# Patient Record
Sex: Male | Born: 1975 | Race: White | Hispanic: No | Marital: Single | Smoking: Never smoker
Health system: Southern US, Community
[De-identification: ages and names within clinical notes are randomized; demographics above are authoritative.]

## PROBLEM LIST (undated history)

## (undated) HISTORY — PX: CHOLECYSTECTOMY: SHX55

## (undated) HISTORY — PX: HERNIA REPAIR: SHX51

---

## 2014-06-04 ENCOUNTER — Inpatient Hospital Stay: Payer: Self-pay | Admitting: Surgery

## 2014-06-04 LAB — COMPREHENSIVE METABOLIC PANEL
ALBUMIN: 4.3 g/dL (ref 3.4–5.0)
ALK PHOS: 102 U/L
Anion Gap: 8 (ref 7–16)
BILIRUBIN TOTAL: 1.4 mg/dL — AB (ref 0.2–1.0)
BUN: 11 mg/dL (ref 7–18)
CHLORIDE: 106 mmol/L (ref 98–107)
Calcium, Total: 8.8 mg/dL (ref 8.5–10.1)
Co2: 27 mmol/L (ref 21–32)
Creatinine: 1.05 mg/dL (ref 0.60–1.30)
EGFR (African American): >60
EGFR (Non-African Amer.): >60
GLUCOSE: 121 mg/dL — AB (ref 65–99)
Osmolality: 282 (ref 275–301)
Potassium: 4 mmol/L (ref 3.5–5.1)
SGOT(AST): 85 U/L — ABNORMAL HIGH (ref 15–37)
SGPT (ALT): 53 U/L (ref 12–78)
Sodium: 141 mmol/L (ref 136–145)
Total Protein: 8 g/dL (ref 6.4–8.2)

## 2014-06-04 LAB — CBC WITH DIFFERENTIAL/PLATELET
BASOS ABS: 0.1 10*3/uL (ref 0.0–0.1)
Basophil %: 0.3 %
EOS ABS: 0 10*3/uL (ref 0.0–0.7)
Eosinophil %: 0.1 %
HCT: 48.3 % (ref 40.0–52.0)
HGB: 15.9 g/dL (ref 13.0–18.0)
LYMPHS PCT: 7.4 %
Lymphocyte #: 1.5 10*3/uL (ref 1.0–3.6)
MCH: 30.4 pg (ref 26.0–34.0)
MCHC: 33 g/dL (ref 32.0–36.0)
MCV: 92 fL (ref 80–100)
MONOS PCT: 5.3 %
Monocyte #: 1.1 x10 3/mm — ABNORMAL HIGH (ref 0.2–1.0)
NEUTROS ABS: 18.2 10*3/uL — AB (ref 1.4–6.5)
NEUTROS PCT: 86.9 %
PLATELETS: 176 10*3/uL (ref 150–440)
RBC: 5.24 10*6/uL (ref 4.40–5.90)
RDW: 13.4 % (ref 11.5–14.5)
WBC: 20.9 10*3/uL — AB (ref 3.8–10.6)

## 2014-06-04 LAB — LIPID PANEL
Cholesterol: 171 mg/dL (ref 0–200)
HDL Cholesterol: 55 mg/dL (ref 40–60)
Ldl Cholesterol, Calc: 99 mg/dL (ref 0–100)
TRIGLYCERIDES: 86 mg/dL (ref 0–200)
VLDL Cholesterol, Calc: 17 mg/dL (ref 5–40)

## 2014-06-04 LAB — LIPASE, BLOOD: Lipase: >10000 U/L — ABNORMAL HIGH (ref 73–393)

## 2014-06-05 LAB — CBC WITH DIFFERENTIAL/PLATELET
BASOS ABS: 0 10*3/uL (ref 0.0–0.1)
BASOS PCT: 0.2 %
EOS PCT: 0 %
Eosinophil #: 0 10*3/uL (ref 0.0–0.7)
HCT: 48.2 % (ref 40.0–52.0)
HGB: 15.7 g/dL (ref 13.0–18.0)
LYMPHS ABS: 0.6 10*3/uL — AB (ref 1.0–3.6)
Lymphocyte %: 5.1 %
MCH: 30.4 pg (ref 26.0–34.0)
MCHC: 32.6 g/dL (ref 32.0–36.0)
MCV: 93 fL (ref 80–100)
MONOS PCT: 5.1 %
Monocyte #: 0.6 x10 3/mm (ref 0.2–1.0)
NEUTROS ABS: 11 10*3/uL — AB (ref 1.4–6.5)
NEUTROS PCT: 89.6 %
Platelet: 144 10*3/uL — ABNORMAL LOW (ref 150–440)
RBC: 5.16 10*6/uL (ref 4.40–5.90)
RDW: 13.5 % (ref 11.5–14.5)
WBC: 12.2 10*3/uL — ABNORMAL HIGH (ref 3.8–10.6)

## 2014-06-05 LAB — BASIC METABOLIC PANEL
Anion Gap: 5 — ABNORMAL LOW (ref 7–16)
BUN: 10 mg/dL (ref 7–18)
Calcium, Total: 8.4 mg/dL — ABNORMAL LOW (ref 8.5–10.1)
Chloride: 107 mmol/L (ref 98–107)
Co2: 30 mmol/L (ref 21–32)
Creatinine: 1.06 mg/dL (ref 0.60–1.30)
EGFR (Non-African Amer.): >60
GLUCOSE: 149 mg/dL — AB (ref 65–99)
Osmolality: 285 (ref 275–301)
Potassium: 4.8 mmol/L (ref 3.5–5.1)
Sodium: 142 mmol/L (ref 136–145)

## 2014-06-05 LAB — URINALYSIS, COMPLETE
Bacteria: NONE SEEN
Bilirubin,UR: NEGATIVE
Blood: NEGATIVE
GLUCOSE, UR: NEGATIVE mg/dL (ref 0–75)
LEUKOCYTE ESTERASE: NEGATIVE
NITRITE: NEGATIVE
Ph: 5 (ref 4.5–8.0)
Protein: NEGATIVE
RBC,UR: <1 /HPF (ref 0–5)
SPECIFIC GRAVITY: 1.017 (ref 1.003–1.030)
Squamous Epithelial: NONE SEEN

## 2014-06-06 LAB — HEPATIC FUNCTION PANEL A (ARMC)
AST: 46 U/L — AB (ref 15–37)
Albumin: 3 g/dL — ABNORMAL LOW (ref 3.4–5.0)
Alkaline Phosphatase: 62 U/L
BILIRUBIN DIRECT: 1.7 mg/dL — AB (ref 0.00–0.20)
BILIRUBIN TOTAL: 4.1 mg/dL — AB (ref 0.2–1.0)
SGPT (ALT): 29 U/L (ref 12–78)
Total Protein: 6.3 g/dL — ABNORMAL LOW (ref 6.4–8.2)

## 2014-06-06 LAB — LIPASE, BLOOD: LIPASE: 3643 U/L — AB (ref 73–393)

## 2014-06-07 LAB — BASIC METABOLIC PANEL
Anion Gap: 6 — ABNORMAL LOW (ref 7–16)
BUN: 7 mg/dL (ref 7–18)
CALCIUM: 7.8 mg/dL — AB (ref 8.5–10.1)
CHLORIDE: 105 mmol/L (ref 98–107)
CO2: 30 mmol/L (ref 21–32)
Creatinine: 0.95 mg/dL (ref 0.60–1.30)
EGFR (African American): >60
Glucose: 96 mg/dL (ref 65–99)
OSMOLALITY: 279 (ref 275–301)
POTASSIUM: 3.8 mmol/L (ref 3.5–5.1)
SODIUM: 141 mmol/L (ref 136–145)

## 2014-06-07 LAB — CBC WITH DIFFERENTIAL/PLATELET
BASOS ABS: 0 10*3/uL (ref 0.0–0.1)
Basophil %: 0.2 %
EOS PCT: 0.1 %
Eosinophil #: 0 10*3/uL (ref 0.0–0.7)
HCT: 40.8 % (ref 40.0–52.0)
HGB: 13.3 g/dL (ref 13.0–18.0)
LYMPHS PCT: 7.3 %
Lymphocyte #: 1.2 10*3/uL (ref 1.0–3.6)
MCH: 30.3 pg (ref 26.0–34.0)
MCHC: 32.5 g/dL (ref 32.0–36.0)
MCV: 93 fL (ref 80–100)
Monocyte #: 1 x10 3/mm (ref 0.2–1.0)
Monocyte %: 6.6 %
NEUTROS ABS: 13.7 10*3/uL — AB (ref 1.4–6.5)
Neutrophil %: 85.8 %
Platelet: 101 10*3/uL — ABNORMAL LOW (ref 150–440)
RBC: 4.37 10*6/uL — ABNORMAL LOW (ref 4.40–5.90)
RDW: 13.8 % (ref 11.5–14.5)
WBC: 15.9 10*3/uL — AB (ref 3.8–10.6)

## 2014-06-07 LAB — HEPATIC FUNCTION PANEL A (ARMC)
ALT: 24 U/L (ref 12–78)
AST: 42 U/L — AB (ref 15–37)
Albumin: 2.6 g/dL — ABNORMAL LOW (ref 3.4–5.0)
Alkaline Phosphatase: 56 U/L
BILIRUBIN DIRECT: 4.5 mg/dL — AB (ref 0.00–0.20)
Bilirubin,Total: 7.7 mg/dL — ABNORMAL HIGH (ref 0.2–1.0)
TOTAL PROTEIN: 5.9 g/dL — AB (ref 6.4–8.2)

## 2014-06-07 LAB — LIPASE, BLOOD: Lipase: 984 U/L — ABNORMAL HIGH (ref 73–393)

## 2014-06-08 LAB — COMPREHENSIVE METABOLIC PANEL
ALT: 23 U/L (ref 12–78)
ANION GAP: 5 — AB (ref 7–16)
Albumin: 2.5 g/dL — ABNORMAL LOW (ref 3.4–5.0)
Alkaline Phosphatase: 58 U/L
BUN: 5 mg/dL — AB (ref 7–18)
Bilirubin,Total: 8.6 mg/dL — ABNORMAL HIGH (ref 0.2–1.0)
CHLORIDE: 101 mmol/L (ref 98–107)
Calcium, Total: 7.8 mg/dL — ABNORMAL LOW (ref 8.5–10.1)
Co2: 33 mmol/L — ABNORMAL HIGH (ref 21–32)
Creatinine: 0.83 mg/dL (ref 0.60–1.30)
EGFR (African American): >60
EGFR (Non-African Amer.): >60
Glucose: 105 mg/dL — ABNORMAL HIGH (ref 65–99)
Osmolality: 275 (ref 275–301)
POTASSIUM: 4.1 mmol/L (ref 3.5–5.1)
SGOT(AST): 33 U/L (ref 15–37)
Sodium: 139 mmol/L (ref 136–145)
Total Protein: 6.1 g/dL — ABNORMAL LOW (ref 6.4–8.2)

## 2014-06-08 LAB — CBC WITH DIFFERENTIAL/PLATELET
BASOS ABS: 0 10*3/uL (ref 0.0–0.1)
Basophil %: 0.3 %
EOS ABS: 0.1 10*3/uL (ref 0.0–0.7)
Eosinophil %: 0.4 %
HCT: 38.9 % — ABNORMAL LOW (ref 40.0–52.0)
HGB: 13 g/dL (ref 13.0–18.0)
LYMPHS PCT: 7.3 %
Lymphocyte #: 1 10*3/uL (ref 1.0–3.6)
MCH: 30.9 pg (ref 26.0–34.0)
MCHC: 33.3 g/dL (ref 32.0–36.0)
MCV: 93 fL (ref 80–100)
MONO ABS: 1.3 x10 3/mm — AB (ref 0.2–1.0)
Monocyte %: 9.5 %
NEUTROS ABS: 10.9 10*3/uL — AB (ref 1.4–6.5)
Neutrophil %: 82.5 %
PLATELETS: 123 10*3/uL — AB (ref 150–440)
RBC: 4.19 10*6/uL — AB (ref 4.40–5.90)
RDW: 13.7 % (ref 11.5–14.5)
WBC: 13.2 10*3/uL — AB (ref 3.8–10.6)

## 2014-06-08 LAB — LIPASE, BLOOD: LIPASE: 274 U/L (ref 73–393)

## 2014-06-09 LAB — COMPREHENSIVE METABOLIC PANEL
ALT: 18 U/L (ref 12–78)
Albumin: 2.3 g/dL — ABNORMAL LOW (ref 3.4–5.0)
Alkaline Phosphatase: 59 U/L
Anion Gap: 7 (ref 7–16)
BILIRUBIN TOTAL: 10.1 mg/dL — AB (ref 0.2–1.0)
BUN: 4 mg/dL — ABNORMAL LOW (ref 7–18)
CALCIUM: 7.5 mg/dL — AB (ref 8.5–10.1)
CHLORIDE: 98 mmol/L (ref 98–107)
CREATININE: 0.72 mg/dL (ref 0.60–1.30)
Co2: 32 mmol/L (ref 21–32)
EGFR (Non-African Amer.): >60
Glucose: 100 mg/dL — ABNORMAL HIGH (ref 65–99)
OSMOLALITY: 271 (ref 275–301)
Potassium: 3.4 mmol/L — ABNORMAL LOW (ref 3.5–5.1)
SGOT(AST): 22 U/L (ref 15–37)
Sodium: 137 mmol/L (ref 136–145)
Total Protein: 5.9 g/dL — ABNORMAL LOW (ref 6.4–8.2)

## 2014-06-09 LAB — CBC WITH DIFFERENTIAL/PLATELET
BASOS PCT: 0.2 %
Basophil #: 0 10*3/uL (ref 0.0–0.1)
Eosinophil #: 0.2 10*3/uL (ref 0.0–0.7)
Eosinophil %: 1.2 %
HCT: 36 % — AB (ref 40.0–52.0)
HGB: 11.9 g/dL — AB (ref 13.0–18.0)
Lymphocyte #: 0.9 10*3/uL — ABNORMAL LOW (ref 1.0–3.6)
Lymphocyte %: 7 %
MCH: 30.5 pg (ref 26.0–34.0)
MCHC: 33.1 g/dL (ref 32.0–36.0)
MCV: 92 fL (ref 80–100)
Monocyte #: 1.4 x10 3/mm — ABNORMAL HIGH (ref 0.2–1.0)
Monocyte %: 11.3 %
NEUTROS ABS: 10.3 10*3/uL — AB (ref 1.4–6.5)
NEUTROS PCT: 80.3 %
Platelet: 113 10*3/uL — ABNORMAL LOW (ref 150–440)
RBC: 3.91 10*6/uL — AB (ref 4.40–5.90)
RDW: 13.7 % (ref 11.5–14.5)
WBC: 12.8 10*3/uL — AB (ref 3.8–10.6)

## 2014-06-09 LAB — HEPATIC FUNCTION PANEL A (ARMC): Bilirubin, Direct: 7.3 mg/dL — ABNORMAL HIGH (ref 0.00–0.20)

## 2014-06-10 LAB — HEPATIC FUNCTION PANEL A (ARMC)
ALBUMIN: 2.3 g/dL — AB (ref 3.4–5.0)
ALT: 21 U/L (ref 12–78)
Alkaline Phosphatase: 71 U/L
BILIRUBIN DIRECT: 9 mg/dL — AB (ref 0.00–0.20)
Bilirubin,Total: 12.6 mg/dL — ABNORMAL HIGH (ref 0.2–1.0)
SGOT(AST): 25 U/L (ref 15–37)
TOTAL PROTEIN: 6 g/dL — AB (ref 6.4–8.2)

## 2014-06-10 LAB — CBC WITH DIFFERENTIAL/PLATELET
Basophil #: 0 10*3/uL (ref 0.0–0.1)
Basophil %: 0.1 %
EOS ABS: 0.2 10*3/uL (ref 0.0–0.7)
EOS PCT: 1.2 %
HCT: 38.4 % — AB (ref 40.0–52.0)
HGB: 13.1 g/dL (ref 13.0–18.0)
LYMPHS ABS: 0.9 10*3/uL — AB (ref 1.0–3.6)
LYMPHS PCT: 6.5 %
MCH: 31.2 pg (ref 26.0–34.0)
MCHC: 34.1 g/dL (ref 32.0–36.0)
MCV: 92 fL (ref 80–100)
MONO ABS: 1.6 x10 3/mm — AB (ref 0.2–1.0)
MONOS PCT: 12.2 %
NEUTROS PCT: 80 %
Neutrophil #: 10.7 10*3/uL — ABNORMAL HIGH (ref 1.4–6.5)
Platelet: 154 10*3/uL (ref 150–440)
RBC: 4.2 10*6/uL — AB (ref 4.40–5.90)
RDW: 14.1 % (ref 11.5–14.5)
WBC: 13.4 10*3/uL — AB (ref 3.8–10.6)

## 2014-06-10 LAB — LIPASE, BLOOD: LIPASE: 88 U/L (ref 73–393)

## 2015-03-14 NOTE — Consult Note (Signed)
PATIENT NAME:  Anthony [name], Anthony [name] MR#:  [id number] DATE OF BIRTH:  1976/02/10  DATE OF CONSULTATION:  06/07/2014  REFERRING PHYSICIAN:   CONSULTING PHYSICIAN:  Christena DeemMartin U. Livvy Spilman, MD  [name] patient of Dr. Natale LayMark Bird.  REASON FOR CONSULTATION: Increased bilirubin, and pancreatitis. Possible need for an ERCP.   HISTORY OF PRESENT ILLNESS: Mr. Anthony [name] is [name] 39 year old Caucasian male, who was in his usual state of health until this past Wednesday. He states that on Wednesday, he had [name] sensation of [name] gas bubble in the epigastric region extending toward both the right and left upper quadrants. This was then followed with some sharp pain, as well as emesis. The pain increased over the course of Thursday, followed by dry heaves. He did come to the hospital late on the evening of the 15th and was admitted for further evaluation and treatment. He was found to have pancreatitis. Over the course of his hospitalization, his lipase has improved. However, his total bilirubin has increased. There was an attempt at an MRCP for further definition of the bile ducts; however, due to patient movement, this was not [name] good study. He states he has had no previous pancreatitis. He did have some similar symptoms back in January. He was [facility] at [facility] for [name] similar presentation, though not as severe. He was told he had [name] bruised sternum and also told he had [name] finding of gallstones at that time. He is currently feeling somewhat better from admission. He is not having near as much emesis and nausea. He continues to be somewhat distended. He has not been passing flatus or stool. He puts his pain level at [name] perhaps 6 to 7 to 10.   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 He denies other hospitalizations or ongoing medical treatment.   SOCIAL HISTORY: Occasional alcohol and tobacco usage. There is no drug usage.    GASTROINTESTINAL FAMILY HISTORY: Negative for colorectal cancer, liver disease or ulcers.   REVIEW OF SYSTEMS: 10 systems reviewed per admission history and physical, agree with same.   PHYSICAL EXAMINATION:  VITAL SIGNS: Temperature is 99.6, pulse 126, respirations 20, blood pressure 162/107 to 150/94, pulse oximetry 94%.  GENERAL: He is [name] 39 year old Caucasian male, no acute distress.  HEENT: Normocephalic, atraumatic. Eyes show [name] mild to moderate scleral icterus. Nose: Septum midline. Oropharynx: No lesions.  HEART: Regular rate and rhythm.  LUNGS: Clear.  ABDOMEN: Mildly to moderately distended, not firm. Bowel sounds are negative. He is tender to palpation throughout the epigastric region, mostly in the left upper quadrant. He has [name] mild to moderate generalized abdominal tenderness, as well. There is no rebound.  EXTREMITIES: No clubbing, cyanosis, or edema.  NEUROLOGICAL: Cranial nerves II through XII grossly intact.   LABORATORY DATA: On admission to the hospital, July 15 at 2148, he had [name] glucose of 121, BUN 11. His metabolic panel otherwise normal. His lipase was greater than 10,000. The lipase has improved over the course of the last 3 days to this morning, being 984. His hepatic profile on admission showed [name] total protein of 8.0, albumin 4.3, total bilirubin 1.4, direct was not done at that time. Alkaline phosphatase 102, AST 85, and ALT 53. LFTs today showed [name] total protein of 5.9, albumin of 2.6, total bilirubin 7.7, direct 4.5, alkaline phosphatase 56, AST 42, ALT 24. His hemogram showed [name] white count  of 20.9 on admission. Currently today 15.9. Hemoglobin and hematocrit 13.3/40.8 today. His platelet count 101,000. Urinalysis was negative.   He had sinus bradycardia, poor quality ECG.   He had an abdominal ultrasound on July 16. This showed the common bile duct to be 4 mm. No intrahepatic or extrahepatic biliary ductal dilatation. There was the impression of cholelithiasis, small  echogenic foci in the neck region, which was shadow, but did not appreciable move.   He had an MR cholangiogram done showing [name] severely degraded examination by patient motion. The patient requested to be removed from the scanner several times. He was also apparently unable to hold his breath at the time. There was [name] note of extensive retroperitoneal edema and fluid consistent with acute pancreatitis. The pancreatic parenchyma was uniformly enhancing with also peripancreatic edema. There was cholelithiasis without clear evidence of cholecystitis.   ASSESSMENT: Biliary pancreatitis. He has had an increasing total and direct bilirubin. His lipase has been improving. There is [name] large amount of edema noted on imaging in the retroperitoneum. It is quite likely that edema locally in the pancreas is inhibiting drainage of the biliary system. It is also possible that there is [name] stone in place that could not be imaged on the magnetic resonance cholangiopancreatography, as noted. Symptomatically he is improving. He is still distended and is not yet passing flatus. Agree with endoscopic retrograde cholangiopancreatography. We will not have this ability until Monday. If there is any acute change requiring endoscopic retrograde cholangiopancreatography  more urgently, we will need to transfer him to available institution. Continue hydration, pain control, and n.p.o. for now.   We will follow with you.   ____________________________ Christena Deem, MD mus:jr D: 06/07/2014 17:48:38 ET T: 06/07/2014 19:37:28 ET JOB#: [id number]  cc: Christena Deem, MD, <Dictator>  Christena Deem MD ELECTRONICALLY SIGNED 06/19/2014 17:44

## 2015-03-14 NOTE — Consult Note (Signed)
Chief Complaint:  Subjective/Chief Complaint seen for biliary pancreatitis, increasing bilirubin. pain 8/10 without meds, passed a small amount of flatus early this am. no bm.   VITAL SIGNS/ANCILLARY NOTES: **Vital Signs.:   19-Jul-15 07:22  Vital Signs Type Q 8hr  Temperature Temperature (F) 99.5  Celsius 37.5  Temperature Source oral  Pulse Pulse 97  Respirations Respirations 18  Systolic BP Systolic BP 409  Diastolic BP (mmHg) Diastolic BP (mmHg) 811  Mean BP 124  Pulse Ox % Pulse Ox % 92  Pulse Ox Activity Level  At rest  Oxygen Delivery Room Air/ 21 %   Brief Assessment:  GEN jaundice   Cardiac Regular   Respiratory clear BS   Gastrointestinal details normal No rebound tenderness  mild distension, bs postive but distant, generalized tenderness,, mostly across epigastrum and llq.   Lab Results: Hepatic:  15-Jul-15 21:48   Bilirubin, Total  1.4  Alkaline Phosphatase 102 (45-117 NOTE: New Reference Range 10/11/13)  SGPT (ALT) 53  SGOT (AST)  85  17-Jul-15 04:55   Bilirubin, Total  4.1  Alkaline Phosphatase 62 (45-117 NOTE: New Reference Range 10/11/13)  SGPT (ALT) 29  SGOT (AST)  46  18-Jul-15 05:30   Bilirubin, Total  7.7  Alkaline Phosphatase 56 (45-117 NOTE: New Reference Range 10/11/13)  SGPT (ALT) 24  SGOT (AST)  42  19-Jul-15 04:53   Bilirubin, Total  8.6  Alkaline Phosphatase 58 (45-117 NOTE: New Reference Range 10/11/13)  SGPT (ALT) 23  SGOT (AST) 33  Total Protein, Serum  6.1  Albumin, Serum  2.5  Routine Chem:  15-Jul-15 21:48   Lipase  > 10000 (Result(s) reported on 04 Jun 2014 at 10:24PM.)  17-Jul-15 04:55   Lipase  3643 (Result(s) reported on 06 Jun 2014 at [facility].)  18-Jul-15 05:30   Lipase  984 (Result(s) reported on 07 Jun 2014 at 06:29AM.)  19-Jul-15 04:53   Glucose, Serum  105  BUN  5  Creatinine (comp) 0.83  Sodium, Serum 139  Potassium, Serum 4.1  Chloride, Serum 101  CO2, Serum  33  Calcium (Total), Serum  7.8   Osmolality (calc) 275  eGFR (African American) >60  eGFR (Non-African American) >60 (eGFR values <64m/min/1.73 m2 may be an indication of chronic kidney disease (CKD). Calculated eGFR is useful in patients with stable renal function. The eGFR calculation will not be reliable in acutely ill patients when serum creatinine is changing rapidly. It is not useful in  patients on dialysis. The eGFR calculation may not be applicable to patients at the low and high extremes of body sizes, pregnant women, and vegetarians.)  Anion Gap  5  Lipase 274 (Result(s) reported on 08 Jun 2014 at 05:37AM.)  Routine Hem:  15-Jul-15 21:48   WBC (CBC)  20.9  16-Jul-15 04:57   WBC (CBC)  12.2  18-Jul-15 05:30   WBC (CBC)  15.9  19-Jul-15 04:53   WBC (CBC)  13.2  RBC (CBC)  4.19  Hemoglobin (CBC) 13.0  Hematocrit (CBC)  38.9  Platelet Count (CBC)  123  MCV 93  MCH 30.9  MCHC 33.3  RDW 13.7  Neutrophil % 82.5  Lymphocyte % 7.3  Monocyte % 9.5  Eosinophil % 0.4  Basophil % 0.3  Neutrophil #  10.9  Lymphocyte # 1.0  Monocyte #  1.3  Eosinophil # 0.1  Basophil # 0.0 (Result(s) reported on 08 Jun 2014 at 05:33AM.)   Radiology Results: [place]    16-Jul-15 00:01, [place]  [place]  Abdomen Limited Survey   REASON FOR EXAM:    ab dpain  COMMENTS:   Body Site: Gallbladder, Liver, Common Bile Duct    PROCEDURE: [place]  - US ABDOMEN LIMITED SURVEY  - Jun 05 2014 12:01AM     CLINICAL DATA:  Abdominal pain    EXAM:  US ABDOMEN LIMITED - RIGHT UPPER QUADRANT    COMPARISON:  None.    FINDINGS:  Gallbladder:  Within the gallbladder, there are small echogenic foci in the neck  region which shadow but do not appreciably move. The largest of  these foci measures 5 mm in length. There is no gallbladder wall  thickening or pericholecystic fluid. No sonographic Murphy sign  noted.    Common bile duct:    Diameter: 4 mm. There is no intrahepatic or extrahepatic biliary  duct  dilatation.    Liver:    No focal lesion identified. Within normal limits in parenchymal  echogenicity.   IMPRESSION:  Cholelithiasis.      Electronically Signed    By: Lowella Grip M.D.    On: 06/05/2014 00:58         Verified By: Leafy Kindle. WOODRUFF, M.D.,  MRI:    17-Jul-15 12:03, MRCP MR Cholangiogram  MRCP MR Cholangiogram   REASON FOR EXAM:    gallstone induced pancreatitis  COMMENTS:       PROCEDURE: MR  - MR MRCP  - Jun 06 2014 12:03PM     CLINICAL DATA:  Gallstone pancreatitis.    EXAM:  MRI ABDOMEN WITHOUT AND WITH CONTRAST (INCLUDING MRCP)    TECHNIQUE:  Multiplanar multisequence MR imaging of the abdomen was performed  both before and after the administration of intravenous contrast.  Heavily T2-weighted images of the biliary and pancreatic ducts were  obtained, and three-dimensional MRCP images were rendered by post  processing.    CONTRAST:  20 mL MultiHance    COMPARISON:  Ultrasound 06/04/2014    FINDINGS:  Exam is severely degraded by patient motion. Patient requested tp be  removed from the scanner several times.    There is significant mild fluid along the anterior perirenal space  on the left and right extending along the pericolic gutters and  along the liver and spleen. This would be consistent given history  pancreatitis.  There are several small gallstones within the neck of the  gallbladder. The gallbladder is not distended and there is no  significant gallbladder wall thickening. There is small amount of  pericholecystic fluid.    The MRCP sequences are are not satisfactory for evaluation due to  patient motion. However, the bile ductdoes not appear dilated on  the axial T2 weighted series (series 21).    On the post-contrast present on the axial T1 series the pancreas is  slightly irregular. There are no organized fluid collections  identified. Pancreas does not appear to enhance uniformly.    The spleen, adrenal glands,  and kidneys are normal. Review of the  lung bases demonstrate indents the pleural fluid. There is no  pericardial fluid.     IMPRESSION:  1. MRCP images are severely degraded due to patient respiratory  motion and are uninterpretable. Often patient's with acute  pancreatitis are poor MRI due to inability to hold breath.  2. On the T2 weighted imaging, there is no evidence of biliary duct  dilatation or common bile duct dilatation  3. Extensive retroperitoneal edema and fluid consistent wit acute  pancreatitis. No organized fluid collections. The pancreatic  parenchyma enhances uniformly. There is pancreatic edema.  4. Cholelithiasis without clear evidence of cholecystitis.      Electronically Signed    By: Suzy Bouchard M.D.    On: 06/06/2014 12:34         Verified By: Rennis Golden, M.D.,   Assessment/Plan:  Assessment/Plan:  Assessment 1) biliary pancreatitis-lipase normalized.  continues with abdominal pain and increasing bili.  likely CBD stone, with obstruction.  lfts otherwise normalized.   Plan 1) continue current.  ERCP for tomorrow.  Will discuss with Dr Candace Cruise or Dr Allen Norris in am.  Low threshold to start abx.  recheck labs in am.   Electronic Signatures: Loistine Simas (MD)  (Signed 19-Jul-15 14:51)  Authored: Chief Complaint, VITAL SIGNS/ANCILLARY NOTES, Brief Assessment, Lab Results, Radiology Results, Assessment/Plan   Last Updated: 19-Jul-15 14:51 by Loistine Simas (MD)

## 2015-03-14 NOTE — Consult Note (Signed)
Brief Consult Note: Diagnosis: biliary pancreatitis.   Patient was seen by consultant.   Consult note dictated.   Recommend to proceed with surgery or procedure.   Recommend further assessment or treatment.   Comments: Please see full GI consult [phone]#[id number].  Patietn admitted with abd pain.n/v, biliary pancreatitis.  Clinically improving, lipase improving, however increasing bili.  Possible impairment of bile duct drainage due to edema versus bile duct stone.  ERCP monday depending on clinical situation.  If there increased problem before then, may need transfer to facility with ERCP availability sooner.  However, currently stable,  consider abx if increased sx.  Following, discussed with Dr Egbert GaribaldiBird.  Electronic Signatures: Barnetta ChapelSkulskie, Martin (MD)  (Signed 18-Jul-15 17:56)  Authored: Brief Consult Note   Last Updated: 18-Jul-15 17:56 by Barnetta ChapelSkulskie, Martin (MD)

## 2015-03-14 NOTE — Consult Note (Signed)
Pt with gallstone pancreatitis. Asked to do ERCP. Risks and complications discussed with patient. Pt intubated for the procedure.Pt with significant edema of duodenum. Spent lot of time tryning to get duodenoscope in position. Unable to locate ampulla due to edema. Elected to stop. Rising bilirubin may be due to signif pancreatitis/edema pressing on the bile duct. If that is the case, jaundice will gradually resolve own. However, if there is a stone causing CBD obstruction, thenpt will need either percutaneous drainage by IR and repeat ERCP at a tertiary center. Discussed with Dr. Skulskie. thanks  ElectrMarva Pandaonic Signatures: Lutricia Feilh, Nikitia Asbill (MD)  (Signed on 20-Jul-15 11:32)  Authored  Last Updated: 20-Jul-15 11:32 by Lutricia Feilh, Dayle Sherpa (MD)

## 2015-03-14 NOTE — Discharge Summary (Signed)
PATIENT NAME:  Anthony [name], Anthony [name] MR#:  [id number] DATE OF BIRTH:  04-Jul-1976  DATE OF ADMISSION:  06/04/2014 DATE OF DISCHARGE:  06/10/2014  FINAL DIAGNOSIS: Biliary pancreatitis and choledocholithiasis.   PRINCIPAL PROCEDURES:  1. MRCP of the abdomen.  2. Surgical consultation.  3. Attempted ERCP by Dr. Bluford Kaufmannh, which was unsuccessful, transferred [facility].  HOSPITAL COURSE SUMMARY: The patient was admitted with significant abdominal pain, found to have pancreatitis and gallstones. An MRCP was performed; however, due to the lack of patient cooperation, the study was not deemed useful. Over the course of the next several days, he had [name] progressive rise in his bilirubin. GI medicine did get consulted. An ERCP was attempted on July 20; however, the ampulla was unable to be cannulated. The [facility] was therefore [facility] for [facility] management.   Medication reconciliation form is found in the chart.    ____________________________ Redge GainerMark [name]. Egbert GaribaldiBird, MD mab:jr D: 06/17/2014 19:33:43 ET T: 06/17/2014 19:54:07 ET JOB#: [id number]  cc: Loraine LericheMark [name]. Egbert GaribaldiBird, MD, <Dictator> Millan Legan [name] Bryah Ocheltree MD ELECTRONICALLY SIGNED 06/17/2014 21:00

## 2015-03-14 NOTE — Consult Note (Signed)
Brief Consult Note: Diagnosis: Biliary colic and GS related pancreatitis.   Patient was seen by consultant.   Recommend further assessment or treatment.   Discussed with Attending MD.   Comments: Once pancreatitis resolves need elective lap chole. will follow.  Electronic Signatures: Natale LayBird, Leocadio Heal (MD)  (Signed 16-Jul-15 15:00)  Authored: Brief Consult Note   Last Updated: 16-Jul-15 15:00 by Natale LayBird, Meganne Rita (MD)

## 2015-03-14 NOTE — H&P (Signed)
PATIENT NAME:  Anthony [name], Anthony [name] MR#:  [id number] DATE OF BIRTH:  [birth year]  DATE OF ADMISSION:  06/04/2014  REFERRING PHYSICIAN:  Dr. Mindi Junker.   PRIMARY CARE PHYSICIAN:  Nonlocal.   CHIEF COMPLAINT:  Abdominal pain.   HISTORY OF PRESENT ILLNESS:  A 39 year old gentleman with history of Gorlin syndrome with history of VP shunt placement presenting with abdominal pain.  Describes acute onset of abdominal pain located right upper quadrant with radiation to the general abdomen.  Pain described as sharp in quality, 8 to 10 in intensity.  No worsening or relieving factors, associated with nausea, vomiting, progressively worsened, presented to the hospital for further work-up and evaluation, found to have pancreatitis with markedly elevated lipase.   REVIEW OF SYSTEMS:  CONSTITUTIONAL:  Denies fever, chills, fatigue, weakness.  EYES:  Denies blurred vision, double vision, eye pain.  EARS, NOSE, THROAT:  Denies tinnitus, ear pain, hearing loss.  RESPIRATORY:  Denies cough, wheeze, shortness of breath.  CARDIOVASCULAR:  Denies chest pain, palpitations, edema.  GASTROINTESTINAL:  Positive for nausea, vomiting, abdominal pain as described above.  Denies any diarrhea or constipation.  GENITOURINARY:  Denies dysuria, hematuria.  ENDOCRINE:  Denies any nocturia or thyroid problems.  HEMATOLOGIC AND LYMPHATIC:  Denies easy bruising, bleeding.  SKIN:  Denies rash or lesions.  MUSCULOSKELETAL:  Denies pain in neck, back, shoulder, knees, hips or arthritic symptoms.  NEUROLOGIC:  Denies paralysis, paresthesias.  PSYCHIATRIC:  Denies anxiety or depressive symptoms.  Otherwise, full review of systems performed by me is negative.   PAST MEDICAL HISTORY:  Gorlin syndrome status post VP shunt, otherwise no further medical history.   SOCIAL HISTORY:  Positive for occasional alcohol usage as well as tobacco usage.  Denies any drug usage.   FAMILY HISTORY:  Positive for hypertension.   ALLERGIES:  DEMEROL,  WHICH CAUSES HALLUCINATIONS.  MORPHINE CAUSING RASH.  HOME MEDICATIONS:  None.   PHYSICAL EXAMINATION: VITAL SIGNS:  Temperature 98.4, heart rate 63, respirations 14, blood pressure 136/85, saturating 100% on room air.  Weight 113.4, BMI 36.9.  GENERAL:  Well-nourished, well-developed, Caucasian gentleman, currently in minimal distress given abdominal pain.  HEAD:  Normocephalic, atraumatic.  EYES:  Pupils equal, round, reactive to light.  Extraocular muscles intact.  No scleral icterus.  MOUTH:  Moist mucous membranes.  Dentition intact.  No abscess noted.  EAR, NOSE, THROAT:  Clear without exudates.  No external lesions.  NECK:  Supple.  No thyromegaly.  No nodules.  No JVD.  PULMONARY:  Clear to auscultation bilaterally without wheezes, rubs or rhonchi.  No use of accessory muscles.  Good respiratory effort.  CHEST:  Nontender to palpation.  CARDIOVASCULAR:  S1, S2, regular rate and rhythm.  No murmurs, rubs, or gallops.  No edema.  Pedal pulses 2+ bilaterally.  GASTROINTESTINAL:  Soft, generally tender to palpation over the entirety of the abdomen, more so on the right as compared to the left.  No rebound or guarding.  No motion tenderness.  Positive bowel sounds.  No hepatosplenomegaly.  MUSCULOSKELETAL:  No swelling, clubbing, edema.  Range of motion full in all extremities.  NEUROLOGIC:  Cranial nerves II through XII intact.  No gross focal neurological deficits.  Sensation intact.  Reflexes intact. SKIN:  No ulceration, lesions, rash, or cyanosis.  Skin warm, dry.  Turgor intact.  PSYCHIATRIC:  Affect within normal limits.  The patient is awake, alert, oriented x 3.  Insight and judgment intact.   LABORATORY DATA:  Sodium 141, potassium 4, chloride 106,  BUN 11, creatinine 1.05, glucose 121, lipase greater than 10,000.  LFTs:  Bilirubin 1.5, AST 85, otherwise within normal limits.  WBC 20.9, hemoglobin 15.9, platelets of 176.   ASSESSMENT AND PLAN:  A 39 year old Caucasian gentleman  with history of Gorlin syndrome, status post ventriculoperitoneal shunt presenting with abdominal pain.  1.  Pancreatitis, given history as well as mildly elevated bilirubin.  Possible gallstone pancreatitis.  We will check a right upper quadrant ultrasound plus he is to remain nothing by mouth until pain improves.  Intravenous fluid hydration, normal saline, pain medications as well as check lipid panel for completeness looking for hypertriglyceridemia given young age.  2.  Leukocytosis.  No indication of infection at this time.  No indication for antibiotics.  3.  Venous thromboembolism prophylaxis with heparin subQ.  4.  CODE STATUS:  THE PATIENT IS FULL CODE.   TIME SPENT:  45 minutes.    ____________________________ Cletis Athensavid K. Macaria Bias, MD dkh:ea D: 06/04/2014 23:22:13 ET T: 06/04/2014 23:34:10 ET JOB#: [id number]  cc: Cletis Athensavid K. Jesyka Slaght, MD, <Dictator> Luvena Wentling Synetta ShadowK Jehan Bonano MD ELECTRONICALLY SIGNED 06/05/2014 21:09

## 2017-11-11 ENCOUNTER — Emergency Department
Admission: EM | Admit: 2017-11-11 | Discharge: 2017-11-11 | Disposition: A | Discharge status: Home / Self Care | Payer: Managed Care, Other (non HMO) | Attending: Emergency Medicine | Admitting: Emergency Medicine

## 2017-11-11 ENCOUNTER — Emergency Department: Payer: Managed Care, Other (non HMO)

## 2017-11-11 ENCOUNTER — Other Ambulatory Visit: Payer: Self-pay

## 2017-11-11 DIAGNOSIS — Y92003 Bedroom of unspecified non-institutional (private) residence as the place of occurrence of the external cause: Secondary | ICD-10-CM | POA: Insufficient documentation

## 2017-11-11 DIAGNOSIS — S12590A Other displaced fracture of sixth cervical vertebra, initial encounter for closed fracture: Secondary | ICD-10-CM | POA: Diagnosis not present

## 2017-11-11 DIAGNOSIS — W06XXXA Fall from bed, initial encounter: Secondary | ICD-10-CM | POA: Insufficient documentation

## 2017-11-11 DIAGNOSIS — Z9049 Acquired absence of other specified parts of digestive tract: Secondary | ICD-10-CM | POA: Insufficient documentation

## 2017-11-11 DIAGNOSIS — S199XXA Unspecified injury of neck, initial encounter: Secondary | ICD-10-CM | POA: Diagnosis present

## 2017-11-11 DIAGNOSIS — S32010A Wedge compression fracture of first lumbar vertebra, initial encounter for closed fracture: Secondary | ICD-10-CM

## 2017-11-11 DIAGNOSIS — Y998 Other external cause status: Secondary | ICD-10-CM | POA: Diagnosis not present

## 2017-11-11 DIAGNOSIS — S12591A Other nondisplaced fracture of sixth cervical vertebra, initial encounter for closed fracture: Secondary | ICD-10-CM

## 2017-11-11 DIAGNOSIS — R42 Dizziness and giddiness: Secondary | ICD-10-CM | POA: Insufficient documentation

## 2017-11-11 DIAGNOSIS — Y9389 Activity, other specified: Secondary | ICD-10-CM | POA: Insufficient documentation

## 2017-11-11 MED ORDER — MORPHINE SULFATE (PF) 4 MG/ML IV SOLN
4.0000 mg | Freq: Once | INTRAVENOUS | Status: AC
  Administered 2017-11-11: 4 mg via INTRAVENOUS
  Filled 2017-11-11: qty 1

## 2017-11-11 MED ORDER — OXYCODONE-ACETAMINOPHEN 5-325 MG PO TABS
1.0000 | ORAL_TABLET | Freq: Once | ORAL | Status: DC
Start: 2017-11-11 — End: 2017-11-11

## 2017-11-11 MED ORDER — ONDANSETRON HCL 4 MG/2ML IJ SOLN
4.0000 mg | Freq: Once | INTRAMUSCULAR | Status: AC
  Administered 2017-11-11: 4 mg via INTRAVENOUS
  Filled 2017-11-11: qty 2

## 2017-11-11 NOTE — ED Notes (Signed)
[facility], [facility]  [phone]

## 2017-11-11 NOTE — ED Notes (Signed)
Called For CT to [facility] images  0901

## 2017-11-11 NOTE — ED Provider Notes (Signed)
[facility] Emergency Department Provider Note  ____________________________________________   First MD Initiated Contact with Patient 11/11/17 0700     (approximate)  I have reviewed the triage vital signs and the nursing notes.   HISTORY  Chief Complaint Fall   HPI Anthony [name] is a 41 y.o. male with a history of a cholecystectomy and hernia repair who is presenting to the emergency department today after a fall.  He says that he was on the side of his bed when he says that he lost judgment of where the bed ended fell flat onto the back of his head, neck and back.  He says that he does not remember if he lost consciousness or not but felt dizzy afterwards.  Says that the worst pain that he is having is at the base of his neck and radiating down all the way to his lower back.  He is denying any numbness or weakness in his arms and his legs.  Does not report any loss of bowel or bladder continence.  Says that he thought that he heard a "crack" to the lower part of his neck when he fell.  He says that he was able to ambulate but when he ambulated the pain was dramatically worsened.   History reviewed. No pertinent past medical history.  There are no active problems to display for this patient.   Past Surgical History:  Procedure Laterality Date  . CHOLECYSTECTOMY    . HERNIA REPAIR      Prior to Admission medications   Not on File    Allergies Demerol [meperidine]  No family history on file.  Social History Social History   Tobacco Use  . Smoking status: Never Smoker  . Smokeless tobacco: Never Used  Substance Use Topics  . Alcohol use: Yes    Comment: socially  . Drug use: No    Review of Systems  Constitutional: No fever/chills Eyes: No visual changes. ENT: No sore throat. Cardiovascular: Denies chest pain. Respiratory: Denies shortness of breath. Gastrointestinal: No abdominal pain.  No nausea, no vomiting.  No diarrhea.   No constipation. Genitourinary: Negative for dysuria. Musculoskeletal: As above Skin: Negative for rash. Neurological: Negative for headaches, focal weakness or numbness.   ____________________________________________   PHYSICAL EXAM:  VITAL SIGNS: ED Triage Vitals  Enc Vitals Group     BP 11/11/17 0646 114/81     Pulse Rate 11/11/17 0646 60     Resp 11/11/17 0646 18     Temp 11/11/17 0646 97.8 F (36.6 C)     Temp Source 11/11/17 0646 Oral     SpO2 11/11/17 0646 96 %     Weight 11/11/17 0647 235 lb (106.6 kg)     Height 11/11/17 0647 6\' 2"  (1.88 m)     Head Circumference --      Peak Flow --      Pain Score 11/11/17 0646 10     Pain Loc --      Pain Edu? --      Excl. in GC? --     Constitutional: Alert and oriented. Well appearing and in no acute distress. Eyes: Conjunctivae are normal.  Head: Atraumatic. Nose: No congestion/rhinnorhea. Mouth/Throat: Mucous membranes are moist.  Neck: No stridor.  Tenderness to palpation over C6 without any deformity or step-off.  No crepitus. Cardiovascular: Normal rate, regular rhythm. Grossly normal heart sounds.  Good peripheral circulation. Respiratory: Normal respiratory effort.  No retractions. Lungs CTAB. Gastrointestinal: Soft and  nontender. No distention. No CVA tenderness. Musculoskeletal: No lower extremity tenderness nor edema.  No joint effusions.  No saddle anesthesia.  5 out of 5 strength to dorsiflexion of the bilateral feet.  Tenderness to palpation along the distribution of the midline thoracic spine without any deformity or step-off.  Minimal tenderness to palpation over the midline lumbar spine without any deformity or step-off.  Neurologic:  Normal speech and language. No gross focal neurologic deficits are appreciated. Skin:  Skin is warm, dry and intact. No rash noted. Psychiatric: Mood and affect are normal. Speech and behavior are normal.  ____________________________________________   LABS (all labs  ordered are listed, but only abnormal results are displayed)  Labs Reviewed - No data to display ____________________________________________  EKG   ____________________________________________  RADIOLOGY  Posterior element fracture of C6.  Also with acute compression fracture of the L1 vertebrae with approximately 30% height loss. ____________________________________________   PROCEDURES  Procedure(s) performed:   Procedures  Critical Care performed:   ____________________________________________   INITIAL IMPRESSION / ASSESSMENT AND PLAN / ED COURSE  Pertinent labs & imaging results that were available during my care of the patient were reviewed by me and considered in my medical decision making (see chart for details).  Cervical fracture, concussion, intracranial hemorrhage, skull fracture, thoracic spine fracture, lumbar spine fracture, lumbar and thoracic contusions As part of my medical decision making, I reviewed the following data within the electronic MEDICAL RECORD NUMBER Old chart reviewed  Pt placed in cervical collar.    ----------------------------------------- 9:41 AM on 11/11/2017 -----------------------------------------  I discussed the case with [facility] Glenetta HewGodfrey [facility] for full evaluation given that the facet fractures are bilateral.  Patient aware of his injuries as well as the need for transfer.  [facility]  Patient remains without any neurologic deficits.  Remains in the cervical collar.  ____________________________________________   FINAL CLINICAL IMPRESSION(S) / ED DIAGNOSES  Cervical spine fracture.  Lumbar spine fracture.    NEW MEDICATIONS STARTED DURING THIS VISIT:  This SmartLink is deprecated. Use AVSMEDLIST instead to display the medication list for a patient.   Note:  This document was prepared using Dragon voice recognition  software and may include unintentional dictation errors.     Myrna BlazerSchaevitz, Corine Solorio Matthew, MD 11/11/17 [phone]

## 2017-11-11 NOTE — Progress Notes (Signed)
CH responded to a PG in ED-19. Pt fell from his bed at home and has broken several vertebra in his back. Mother and Stepfather are bedside. Mother is highly anxious. Mother stated the Pt has had medical issues for all of his 41 years. CH spent time with mother providing a calming presence. Mother regained composure. Pt stated he is scared, but apprec[facility]. [facility].     11/11/17 1100  Clinical Encounter Type  Visited With Patient;Patient and family together;Health care provider  Visit Type Initial;Spiritual support;ED  Referral From Nurse  Spiritual Encounters  Spiritual Needs Emotional

## 2017-11-11 NOTE — ED Notes (Signed)
Patient will be given D/C instructions on [facility]. Vital signs stable and pain controlled per pt. Patient In Not in Acute Distress at time of transfer to [facility] and denies further concerns regarding this visit. Patient stable at the time of departure from the unit, departing unit by the safest and most appropriate manner per patient condition and limitations with all belongings accounted for. Patient transferred on back board with C collar by [facility].

## 2017-11-11 NOTE — ED Notes (Signed)
emtala reviewed by this RN 

## 2017-11-11 NOTE — ED Notes (Signed)
Called [facility] for consult

## 2017-11-11 NOTE — ED Triage Notes (Signed)
Pt comes via [facility] with c/o fall from home. Pt is A&OX4. Pt fell on bedroom floor got up and then went to bathroom and he got dizzy and had to lay down on floor with mom's assistance. Vitals per EMS 110/60, HR- 57. Respirations even and unlabored.

## 2017-11-11 NOTE — ED Notes (Signed)
[facility] Transfer center  EnetaiAshley called and informed patient is [facility].  [phone]

## 2017-11-11 NOTE — ED Notes (Signed)
Patient transported to CT 

## 2017-12-08 ENCOUNTER — Emergency Department: Payer: Managed Care, Other (non HMO)

## 2017-12-08 ENCOUNTER — Other Ambulatory Visit: Payer: Self-pay

## 2017-12-08 ENCOUNTER — Encounter: Payer: Self-pay | Admitting: Emergency Medicine

## 2017-12-08 ENCOUNTER — Emergency Department
Admission: EM | Admit: 2017-12-08 | Discharge: 2017-12-08 | Disposition: A | Discharge status: Home / Self Care | Payer: Managed Care, Other (non HMO) | Attending: Emergency Medicine | Admitting: Emergency Medicine

## 2017-12-08 DIAGNOSIS — K5900 Constipation, unspecified: Secondary | ICD-10-CM | POA: Diagnosis present

## 2017-12-08 DIAGNOSIS — K5903 Drug induced constipation: Secondary | ICD-10-CM | POA: Diagnosis not present

## 2017-12-08 MED ORDER — ONDANSETRON 8 MG PO TBDP: 8.0000 mg | ORAL_TABLET | Freq: Three times a day (TID) | ORAL | 0 refills | Status: AC | PRN

## 2017-12-08 MED ORDER — ONDANSETRON 8 MG PO TBDP
8.0000 mg | ORAL_TABLET | Freq: Once | ORAL | Status: AC
Start: 2017-12-08 — End: 2017-12-08
  Administered 2017-12-08: 8 mg via ORAL
  Filled 2017-12-08: qty 1

## 2017-12-08 MED ORDER — MAGNESIUM CITRATE PO SOLN: 1.0000 | Freq: Once | ORAL | 0 refills | Status: AC

## 2017-12-08 MED ORDER — POLYETHYLENE GLYCOL 3350 17 G PO PACK: 17.0000 g | PACK | Freq: Every day | ORAL | 2 refills | Status: AC

## 2017-12-08 MED ORDER — MAGNESIUM CITRATE PO SOLN
1.0000 | Freq: Once | ORAL | Status: AC
  Administered 2017-12-08: 1 via ORAL
  Filled 2017-12-08: qty 296

## 2017-12-08 NOTE — ED Triage Notes (Addendum)
Brought in via ems   States he has been taking a lot of narcotic pain meds and flexeril   And now is not able to have a BM  Was able to pass stool 3 days ago  Small amt yesterday  Now having increased  No n/v

## 2017-12-08 NOTE — ED Provider Notes (Signed)
[facility] Emergency Department Provider Note   ____________________________________________   First MD Initiated Contact with Patient 12/08/17 1226     (approximate)  I have reviewed the triage vital signs and the nursing notes.   HISTORY  Chief Complaint Constipation    HPI Anthony [name] is a 42 y.o. male patient arrived via EMS complaining of no bowel movements for 3 days.  Patient is on chronic narcotic medication and muscle relaxers secondary to cervical and lumbar fractures.  Patient stated started a stool softener yesterday with no relief.  Patient rates his pain as a 6/10.  Patient described the pain as "abdominal fullness."  History reviewed. No pertinent past medical history.  There are no active problems to display for this patient.   Past Surgical History:  Procedure Laterality Date  . CHOLECYSTECTOMY    . HERNIA REPAIR      Prior to Admission medications   Medication Sig Start Date End Date Taking? Authorizing Provider  magnesium citrate SOLN Take 296 mLs (1 Bottle total) by mouth once for 1 dose. 12/08/17 12/08/17  Joni ReiningSmith, Ronald K, PA-C  polyethylene glycol [facility](MIRALAX) packet Take 17 g by mouth daily. 12/08/17   Joni ReiningSmith, Ronald K, PA-C    Allergies Demerol [meperidine]  No family history on file.  Social History Social History   Tobacco Use  . Smoking status: Never Smoker  . Smokeless tobacco: Never Used  Substance Use Topics  . Alcohol use: Yes    Comment: socially  . Drug use: No    Review of Systems Constitutional: No fever/chills Eyes: No visual changes. ENT: No sore throat. Cardiovascular: Denies chest pain. Respiratory: Denies shortness of breath. Gastrointestinal: Left abdominal pain.  No nausea, no vomiting.  Constipation genitourinary: Negative for dysuria. Musculoskeletal: Positive for neck and back pain Skin: Negative for rash. Neurological: Negative for headaches, focal weakness or  numbness. Allergic/Immunilogical: Demerol ____________________________________________   PHYSICAL EXAM:  VITAL SIGNS: ED Triage Vitals  Enc Vitals Group     BP 12/08/17 1157 (!) 142/84     Pulse Rate 12/08/17 1157 (!) 109     Resp 12/08/17 1157 18     Temp 12/08/17 1157 98.5 F (36.9 C)     Temp src --      SpO2 12/08/17 1157 96 %     Weight 12/08/17 1158 240 lb (108.9 kg)     Height 12/08/17 1158 6\' 2"  (1.88 m)     Head Circumference --      Peak Flow --      Pain Score 12/08/17 1158 6     Pain Loc --      Pain Edu? --      Excl. in GC? --    Constitutional: Alert and oriented. Well appearing and in no acute distress. Neck: No stridor.  Wearing neck splint . Cardiovascular: Normal rate, regular rhythm. Grossly normal heart sounds.  Good peripheral circulation. Respiratory: Normal respiratory effort.  No retractions. Lungs CTAB. Gastrointestinal: Soft and nontender. No distention. No abdominal bruits. No CVA tenderness.  Decreased bowel sounds Psychiatric: Mood and affect are normal. Speech and behavior are normal.  ____________________________________________   LABS (all labs ordered are listed, but only abnormal results are displayed)  Labs Reviewed - No data to display ____________________________________________  EKG   ____________________________________________  RADIOLOGY  Dg Abdomen 1 View  Result Date: 12/08/2017 CLINICAL DATA:  Constipation EXAM: ABDOMEN - 1 VIEW COMPARISON:  None. FINDINGS: There is diffuse stool throughout colon. There is  no bowel dilatation or air-fluid level to suggest bowel obstruction. No free air. No abnormal calcifications. IMPRESSION: Diffuse stool throughout colon consistent with constipation. No bowel obstruction or free air evident. Electronically Signed   By: Bretta Bang III M.D.   On: 12/08/2017 13:02    ____________________________________________   PROCEDURES  Procedure(s) performed:  None  Procedures  Critical Care performed: No  ____________________________________________   INITIAL IMPRESSION / ASSESSMENT AND PLAN / ED COURSE  As part of my medical decision making, I reviewed the following data within the electronic MEDICAL RECORD NUMBER    Constipation secondary to narcotic medication.  Discussed x-ray findings with patient.  Patient given citrate of magnesium prior to departure.  Patient given a prescription for MiraLAX and advised to continue to take stool softener as directed.  Patient advised to discuss this concern with his prescribing doctor.      ____________________________________________   FINAL CLINICAL IMPRESSION(S) / ED DIAGNOSES  Final diagnoses:  Constipation due to pain medication     ED Discharge Orders        Ordered    polyethylene glycol (MIRALAX) packet  Daily     12/08/17 1332    magnesium citrate SOLN   Once     12/08/17 1339       Note:  This document was prepared using Dragon voice recognition software and may include unintentional dictation errors.    Joni Reining, PA-C 12/08/17 1350    Dionne Bucy, MD 12/08/17 1556

## 2019-05-09 IMAGING — CT CT HEAD W/O CM
4 of 7 series · 13 of 47 positions shown, 14 images · non-contrast
Comparison: None.

CLINICAL DATA: Pt fell on bedroom floor got up and then went to
bathroom and he got dizzy and had to lay down on floor with mom's
assistance

EXAM:
CT HEAD WITHOUT CONTRAST
CT CERVICAL SPINE WITHOUT CONTRAST
TECHNIQUE: Multidetector CT imaging of the head and cervical spine was
performed following the standard protocol without intravenous
contrast. Multiplanar CT image reconstructions of the cervical spine
were also generated.

[Series 2: head wo · axial · 0.47mm/px · z∈[-122,-32]mm · 3 of 37 slices shown, 4 images]
[im 10/37  brain]
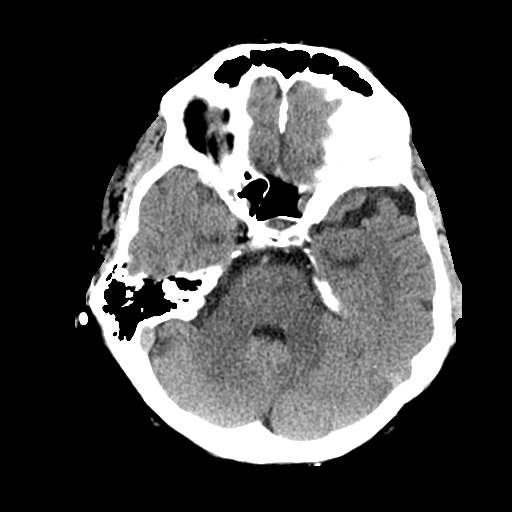
[im 10/37  bone]
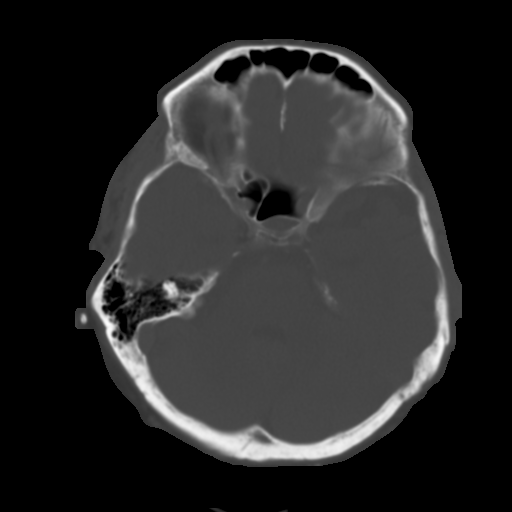
[im 19/37  brain]
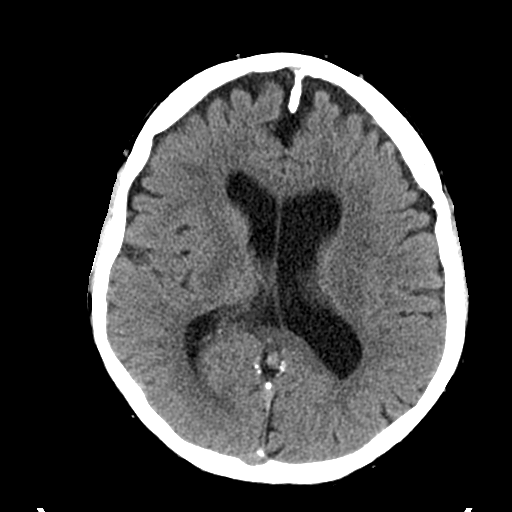
[im 28/37  brain]
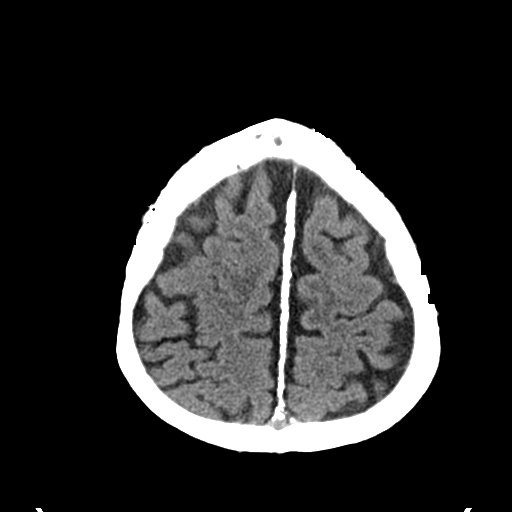

[Series 5: sagittal soft tissue · sagittal · 0.37mm/px · 2 of 71 slices shown]
[im 24/71  brain]
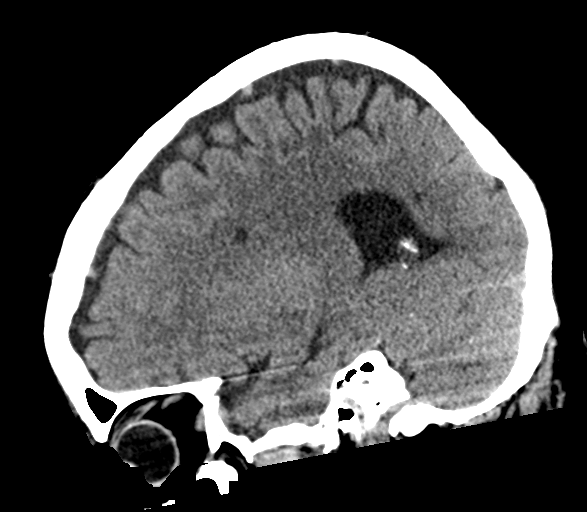
[im 47/71  brain]
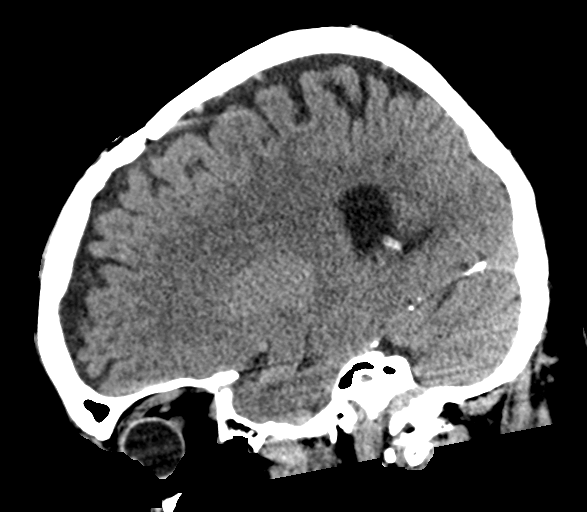

[Series 11: coronal bone · coronal · 0.28mm/px · 3 of 53 slices shown]
[im 20/53  brain]
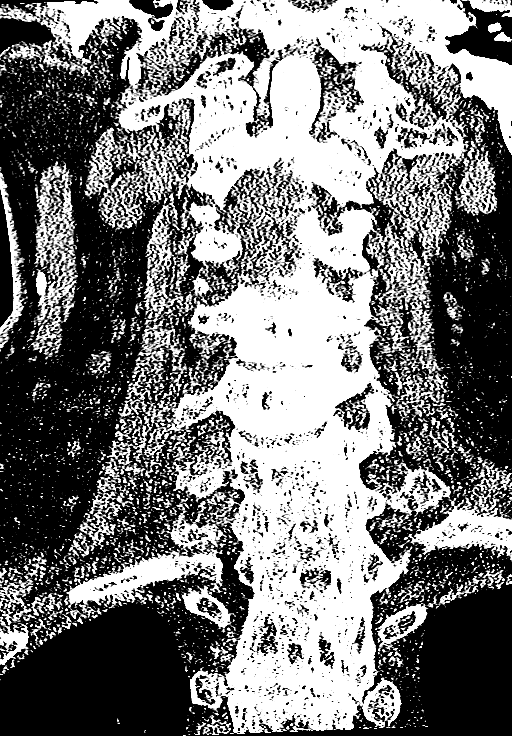
[im 27/53  brain]
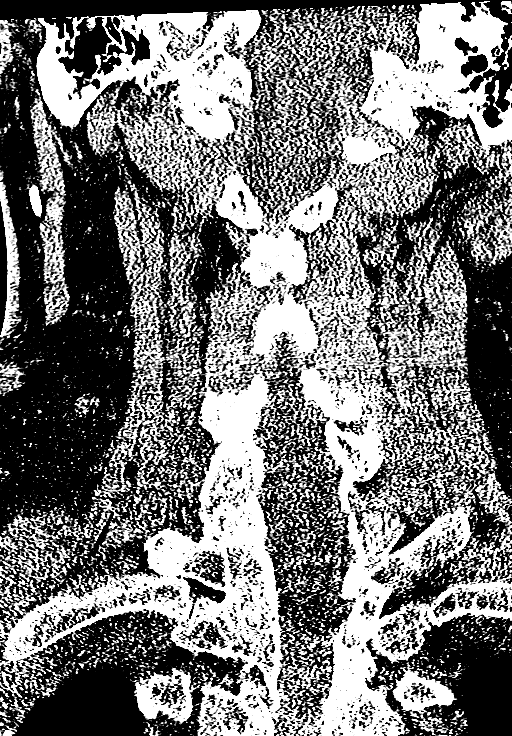
[im 33/53  brain]
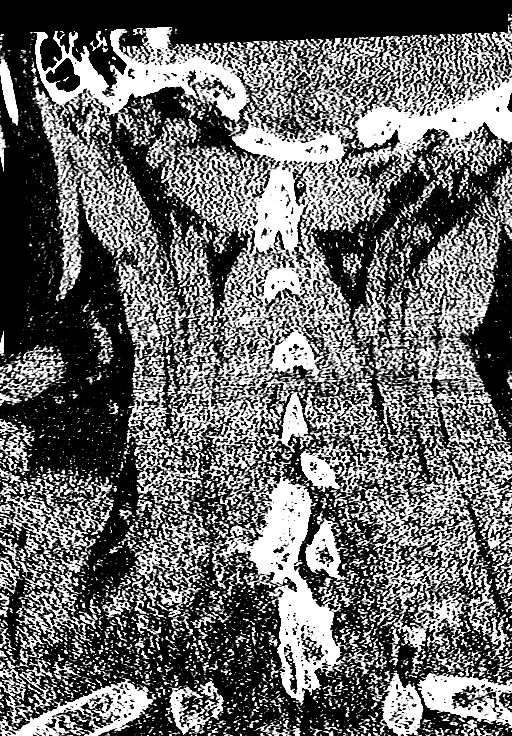

[Series 12: orthogonal bone · axial · 0.26mm/px · z∈[-352,-254]mm · 5 of 103 slices shown]
[im 9/103  bone]
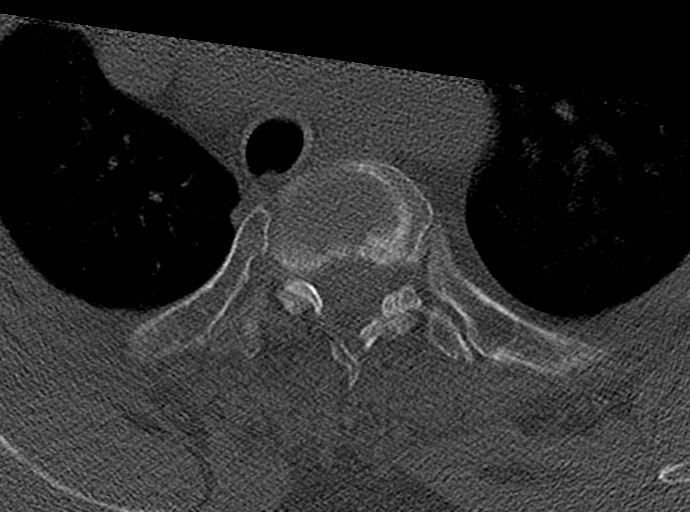
[im 26/103  bone]
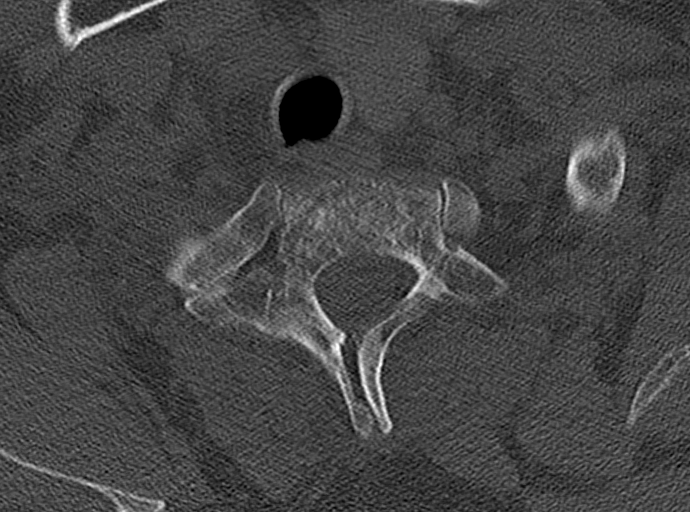
[im 35/103  bone]
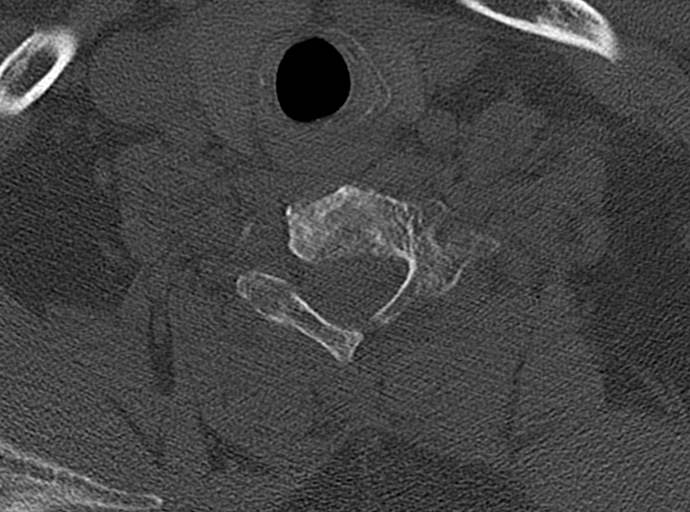
[im 43/103  bone]
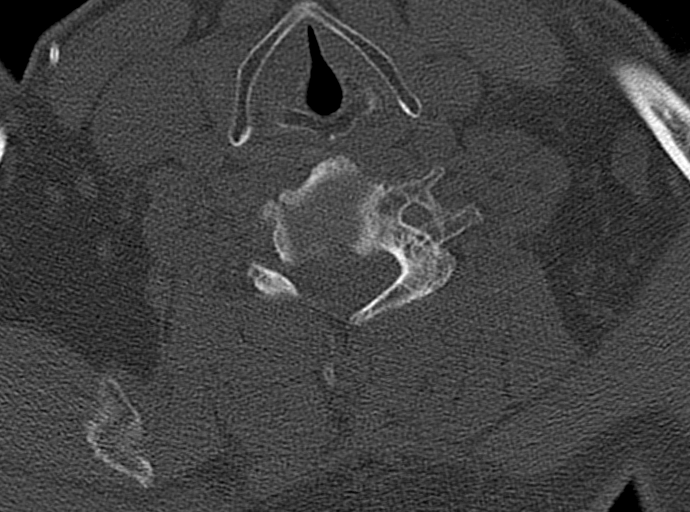
[im 60/103  bone]
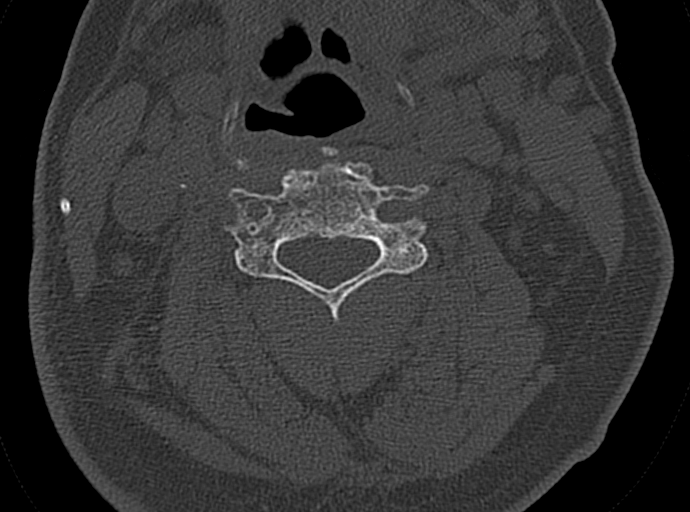

[13 of 47 positions shown; findings below may reference images not displayed]

FINDINGS: CT HEAD FINDINGS

Brain: No evidence of acute infarction, hemorrhage, hydrocephalus,
extra-axial collection or mass lesion/mass effect.

There is ventricular sulcal enlargement reflecting atrophy advanced
for age. There are few small foci of hypoattenuation in the white
matter on the left consistent with small old lacune infarcts.

Vascular: No hyperdense vessel or unexpected calcification.

Skull: Skull is demineralized. There is a catheter that extends
along the right frontal bone. Catheter enters the anterior frontal
bone lying adjacent to the anterior interhemispheric fissure. No
acute skull fracture or discrete skull lesions.

Sinuses/Orbits: Globes and orbits are unremarkable.

Other: Mild moderate mucosal thickening lines the ethmoid air cells
and left maxillary sinus. Mild polypoid mucosal thickening noted in
the right maxillary sinus. There changes consistent with prior sinus
surgery with a wide left maxillary sinus medial antrectomy. Status
post left mastoidectomy. Mastoid air cells are clear.

CT CERVICAL SPINE FINDINGS

Alignment: Slight reversal of the normal cervical lordosis centered
at C7. No spondylolisthesis.

Skull base and vertebrae: There are developmental anomalies with
rudimentary, partially form discs at C6-C7 and C7-T1. The vertebrae
are decreased in the anterior to posterior dimension.

At the C6 level, there is a fracture that extends from the pars
interarticularis across the right pedicle. There is also fracture on
the left extending across the lamina to the left facet. The
fractures are non displaced and non comminuted. No vertebral body
fractures seen.

There are no other fractures.  No bone lesions.

Soft tissues and spinal canal: No prevertebral fluid or swelling. No
visible canal hematoma.

Disc levels: Rudimentary discs at C6-C7 and C7-T1 where there is a
developmental anomaly. Mild loss of disc height at C4-C5. End plate
spurring noted from C3 through C6. No convincing disc herniation.

Upper chest: Clear lung apices.  No masses.  No acute findings.

Other: None.
IMPRESSION: HEAD CT

1. No acute intracranial abnormalities.
2. Atrophy advanced for age.
3. Postsurgical changes with placement of a frontal extra-axial
space catheter.
CERVICAL CT

1. Acute nondisplaced fractures of the posterior elements at C6-C7.
No other fractures or acute findings.
2. Developmental anomalies at C6, C7 and T1.

## 2019-06-05 IMAGING — CR DG ABDOMEN 1V
2 series · 2 of 2 positions shown · non-contrast
Comparison: None.

CLINICAL DATA: Constipation

EXAM:
ABDOMEN - 1 VIEW

[abdomen kub (1 of 2)]
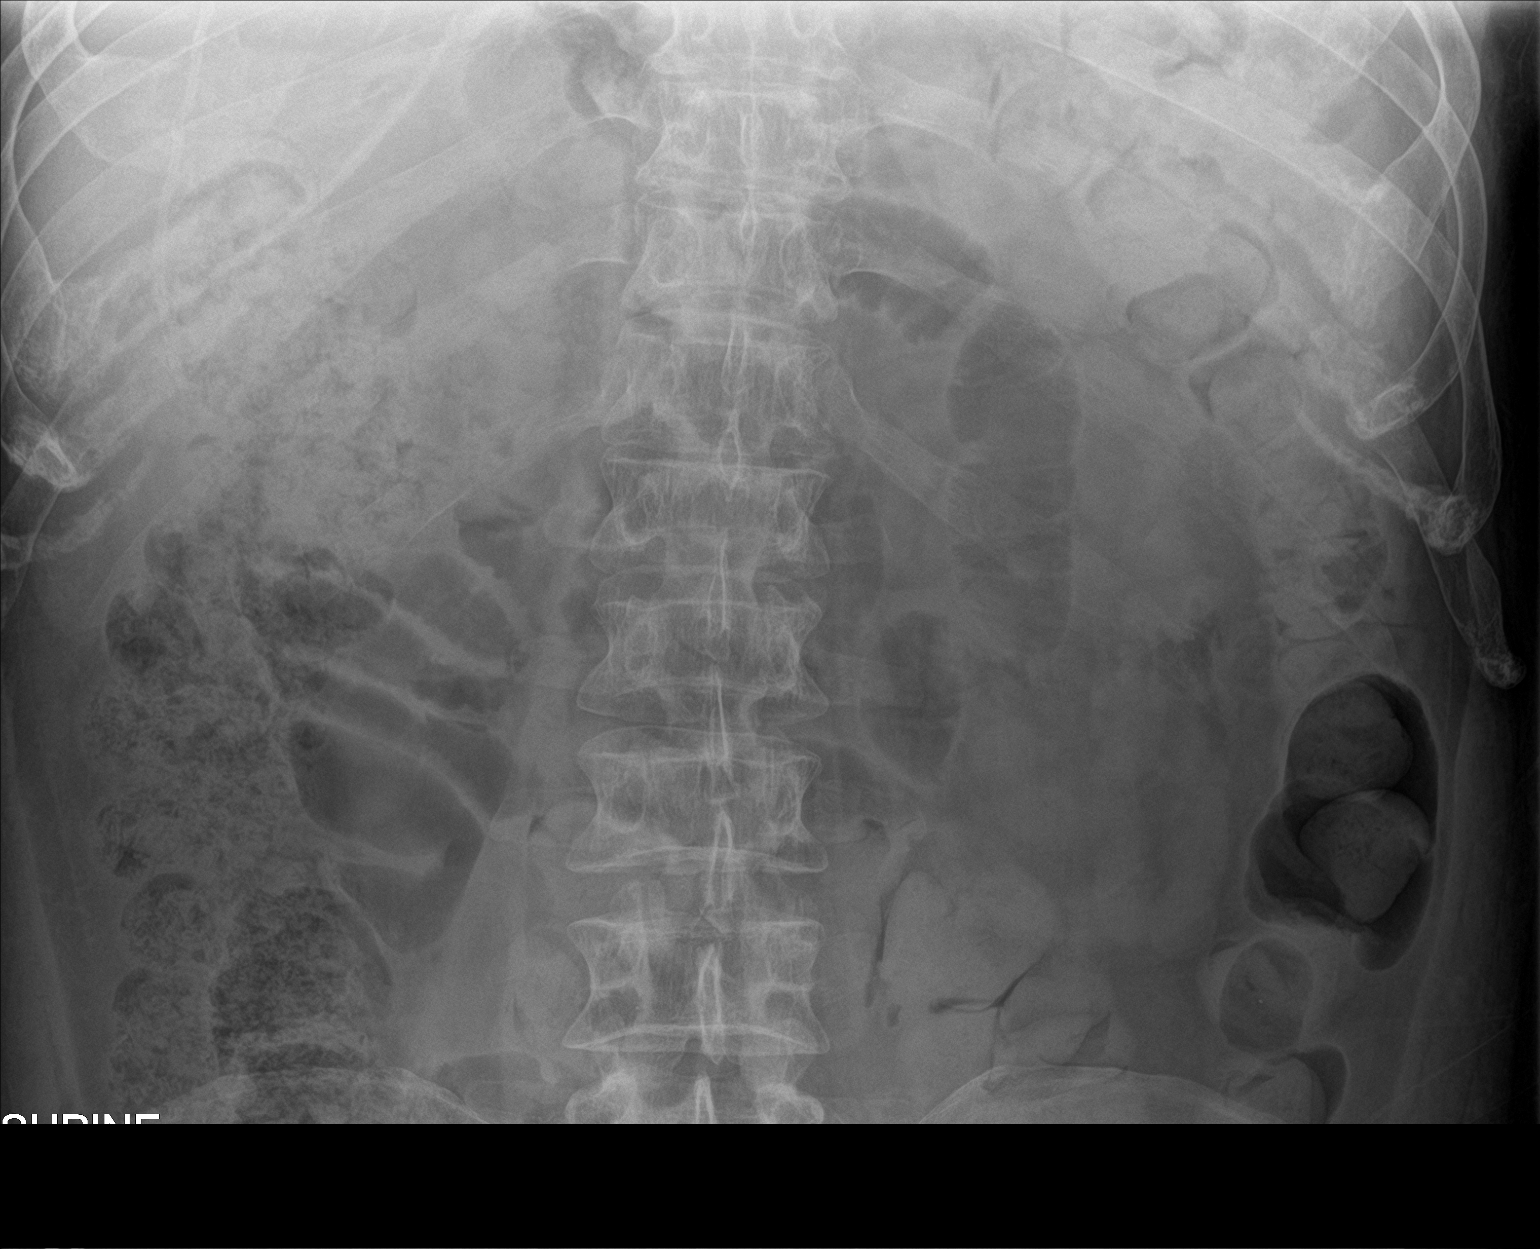

[abdomen kub (2 of 2)]
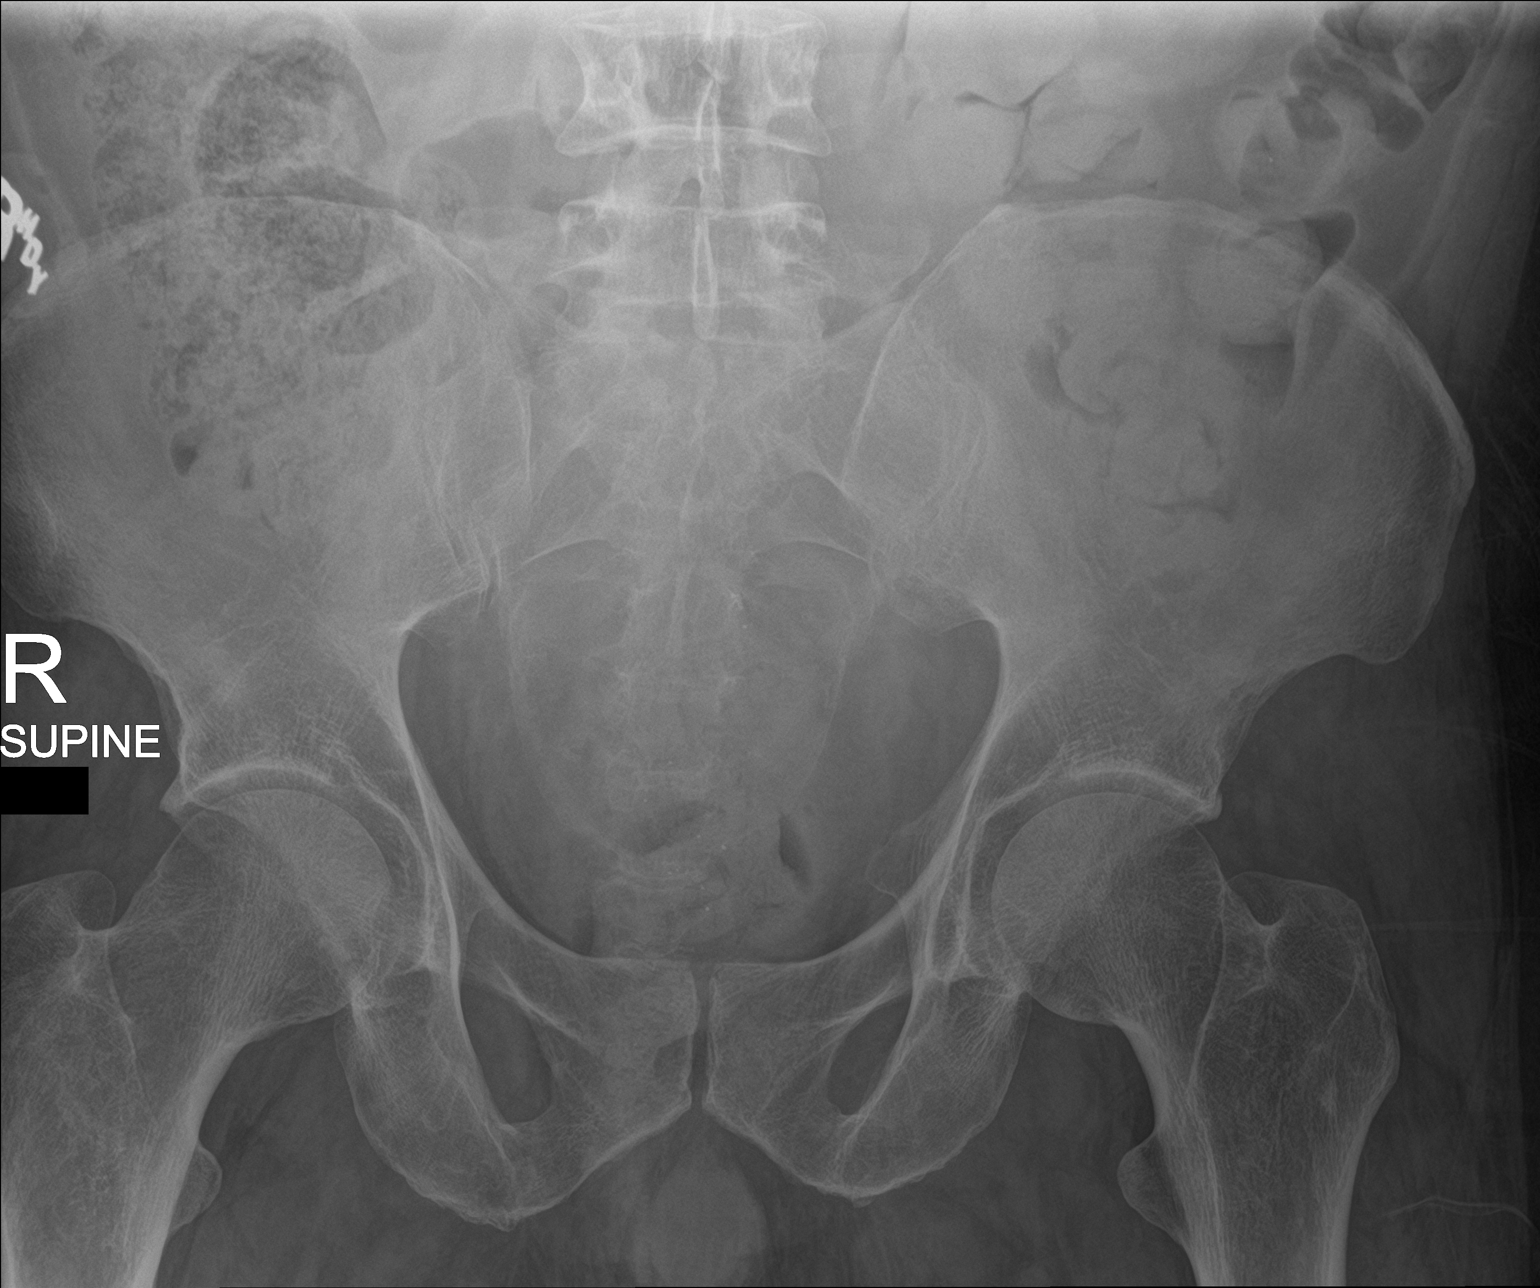

[2 of 2 positions shown; findings below may reference images not displayed]

FINDINGS: There is diffuse stool throughout colon. There is no bowel
dilatation or air-fluid level to suggest bowel obstruction. No free
air. No abnormal calcifications.
IMPRESSION: Diffuse stool throughout colon consistent with constipation. No
bowel obstruction or free air evident.
